# Patient Record
Sex: Male | Born: 1980 | Race: Black or African American | Hispanic: No | Marital: Single
Health system: Southern US, Community
[De-identification: ages and names within clinical notes are randomized; demographics above are authoritative.]

---

## 1998-06-19 ENCOUNTER — Emergency Department (HOSPITAL_COMMUNITY): Admission: EM | Admit: 1998-06-19 | Discharge: 1998-06-19 | Payer: Self-pay | Admitting: Emergency Medicine

## 2001-02-16 ENCOUNTER — Emergency Department (HOSPITAL_COMMUNITY): Admission: EM | Admit: 2001-02-16 | Discharge: 2001-02-16 | Payer: Self-pay | Admitting: Emergency Medicine

## 2001-02-16 ENCOUNTER — Encounter: Payer: Self-pay | Admitting: Emergency Medicine

## 2002-02-09 ENCOUNTER — Emergency Department (HOSPITAL_COMMUNITY): Admission: EM | Admit: 2002-02-09 | Discharge: 2002-02-09 | Payer: Self-pay

## 2002-03-17 ENCOUNTER — Emergency Department (HOSPITAL_COMMUNITY): Admission: EM | Admit: 2002-03-17 | Discharge: 2002-03-18 | Payer: Self-pay | Admitting: Emergency Medicine

## 2002-03-17 ENCOUNTER — Encounter: Payer: Self-pay | Admitting: Emergency Medicine

## 2017-12-25 ENCOUNTER — Emergency Department (HOSPITAL_COMMUNITY)
Admission: EM | Admit: 2017-12-25 | Discharge: 2017-12-26 | Disposition: A | Discharge status: Home / Self Care | Payer: BLUE CROSS/BLUE SHIELD | Attending: Emergency Medicine | Admitting: Emergency Medicine

## 2017-12-25 ENCOUNTER — Emergency Department (HOSPITAL_COMMUNITY): Payer: BLUE CROSS/BLUE SHIELD

## 2017-12-25 ENCOUNTER — Encounter (HOSPITAL_COMMUNITY): Payer: Self-pay | Admitting: Emergency Medicine

## 2017-12-25 ENCOUNTER — Other Ambulatory Visit: Payer: Self-pay

## 2017-12-25 DIAGNOSIS — R55 Syncope and collapse: Secondary | ICD-10-CM | POA: Insufficient documentation

## 2017-12-25 DIAGNOSIS — R7989 Other specified abnormal findings of blood chemistry: Secondary | ICD-10-CM | POA: Diagnosis not present

## 2017-12-25 DIAGNOSIS — R079 Chest pain, unspecified: Secondary | ICD-10-CM | POA: Diagnosis not present

## 2017-12-25 LAB — CBC WITH DIFFERENTIAL/PLATELET
Abs Immature Granulocytes: 0 10*3/uL (ref 0.00–0.07)
BASOS ABS: 0 10*3/uL (ref 0.0–0.1)
BASOS PCT: 1 %
EOS PCT: 2 %
Eosinophils Absolute: 0.1 10*3/uL (ref 0.0–0.5)
HEMATOCRIT: 44.4 % (ref 39.0–52.0)
Hemoglobin: 14.3 g/dL (ref 13.0–17.0)
Immature Granulocytes: 0 %
LYMPHS ABS: 1.4 10*3/uL (ref 0.7–4.0)
Lymphocytes Relative: 45 %
MCH: 28.3 pg (ref 26.0–34.0)
MCHC: 32.2 g/dL (ref 30.0–36.0)
MCV: 87.9 fL (ref 80.0–100.0)
MONOS PCT: 8 %
Monocytes Absolute: 0.3 10*3/uL (ref 0.1–1.0)
NRBC: 0 % (ref 0.0–0.2)
Neutro Abs: 1.4 10*3/uL — ABNORMAL LOW (ref 1.7–7.7)
Neutrophils Relative %: 44 %
Platelets: 250 10*3/uL (ref 150–400)
RBC: 5.05 MIL/uL (ref 4.22–5.81)
RDW: 12.9 % (ref 11.5–15.5)
WBC: 3.1 10*3/uL — ABNORMAL LOW (ref 4.0–10.5)

## 2017-12-25 LAB — COMPREHENSIVE METABOLIC PANEL
ALT: 16 U/L (ref 0–44)
AST: 32 U/L (ref 15–41)
Albumin: 3.6 g/dL (ref 3.5–5.0)
Alkaline Phosphatase: 53 U/L (ref 38–126)
Anion gap: 8 (ref 5–15)
BUN: 8 mg/dL (ref 6–20)
CHLORIDE: 104 mmol/L (ref 98–111)
CO2: 24 mmol/L (ref 22–32)
Calcium: 8.5 mg/dL — ABNORMAL LOW (ref 8.9–10.3)
Creatinine, Ser: 1.28 mg/dL — ABNORMAL HIGH (ref 0.61–1.24)
Glucose, Bld: 152 mg/dL — ABNORMAL HIGH (ref 70–99)
POTASSIUM: 3.8 mmol/L (ref 3.5–5.1)
Sodium: 136 mmol/L (ref 135–145)
Total Bilirubin: 0.9 mg/dL (ref 0.3–1.2)
Total Protein: 5.7 g/dL — ABNORMAL LOW (ref 6.5–8.1)

## 2017-12-25 LAB — I-STAT CHEM 8, ED
BUN: 9 mg/dL (ref 6–20)
Calcium, Ion: 1.05 mmol/L — ABNORMAL LOW (ref 1.15–1.40)
Chloride: 101 mmol/L (ref 98–111)
Creatinine, Ser: 1.2 mg/dL (ref 0.61–1.24)
Glucose, Bld: 148 mg/dL — ABNORMAL HIGH (ref 70–99)
HEMATOCRIT: 42 % (ref 39.0–52.0)
HEMOGLOBIN: 14.3 g/dL (ref 13.0–17.0)
POTASSIUM: 3.9 mmol/L (ref 3.5–5.1)
SODIUM: 137 mmol/L (ref 135–145)
TCO2: 26 mmol/L (ref 22–32)

## 2017-12-25 LAB — I-STAT TROPONIN, ED: Troponin i, poc: 0 ng/mL (ref 0.00–0.08)

## 2017-12-25 MED ORDER — IOPAMIDOL (ISOVUE-370) INJECTION 76%
125.0000 mL | Freq: Once | INTRAVENOUS | Status: AC | PRN
  Administered 2017-12-25: 125 mL via INTRAVENOUS

## 2017-12-25 MED ORDER — IOPAMIDOL (ISOVUE-370) INJECTION 76%
50.0000 mL | Freq: Once | INTRAVENOUS | Status: AC | PRN
  Administered 2017-12-25: 50 mL via INTRAVENOUS

## 2017-12-25 MED ORDER — IOPAMIDOL (ISOVUE-370) INJECTION 76%
INTRAVENOUS | Status: AC
  Filled 2017-12-25: qty 100

## 2017-12-25 MED ORDER — IOPAMIDOL (ISOVUE-370) INJECTION 76%
INTRAVENOUS | Status: AC
  Filled 2017-12-25: qty 50

## 2017-12-25 NOTE — ED Triage Notes (Signed)
Pt at restaurant with friends and had just finished eating dinner. Pt states he felt severe pain in left side of neck at base of skull, lasted for 10 min. Caused near syncope. EMS arrived, pt grey and lying on ice pack on neck. CBG 110, BP systolic 100, HR NSR. 3/10 pain on arrival. 18 g LAC saline locked. Pt AO x 4 on arrival.

## 2017-12-25 NOTE — ED Provider Notes (Signed)
[facility] EMERGENCY DEPARTMENT Provider Note   CSN: [id number] Arrival date & time: 12/25/17  2229     History   Chief Complaint Chief Complaint  Patient presents with  . Near Syncope    HPI Duane Tucker is a 37 y.o. male with a hx of no major medical problems presents to the Emergency Department complaining of acute, persistent, posterior neck and head pain onset approx 30 min PTA.  Symptoms were associated with near syncope, vision changes and shortness of breath.  Pt reports he traveled via car today from [place].  He denies rash, difficulty swallowing, swelling of neck/tongue.  He denies CP, abd pain, vomiting.  He did have associated nausea.  Pt denies smoking or drug history.  He reports drinking 8oz of EtOH today.  Pt reports resolution of vision changes, but posterior neck pain persists.  Pt denies aggravating or alleviating factors.  Movement and palpation of his neck do not worsen his symptoms.    Per EMS, on their arrival, pt was profusely diaphoretic, gray in color and semi-responsive. Initial SBP 100.    The history is provided by the patient, the EMS personnel and medical records. No language interpreter was used.    History reviewed. No pertinent past medical history.  There are no active problems to display for this patient.   History reviewed. No pertinent surgical history.      Home Medications    Prior to Admission medications   Not on File    Family History No family history on file.  Social History Social History   Tobacco Use  . Smoking status: Not on file  Substance Use Topics  . Alcohol use: Yes    Comment: social   . Drug use: Not on file     Allergies   Acetaminophen   Review of Systems Review of Systems  Constitutional: Positive for diaphoresis. Negative for appetite change, fatigue, fever and unexpected weight change.  HENT: Negative for mouth sores.   Eyes: Negative for visual disturbance.    Respiratory: Positive for shortness of breath. Negative for cough, chest tightness and wheezing.   Cardiovascular: Negative for chest pain.  Gastrointestinal: Positive for nausea. Negative for abdominal pain, constipation, diarrhea and vomiting.  Endocrine: Negative for polydipsia, polyphagia and polyuria.  Genitourinary: Negative for dysuria, frequency, hematuria and urgency.  Musculoskeletal: Positive for neck pain. Negative for back pain and neck stiffness.  Skin: Negative for rash.  Allergic/Immunologic: Negative for immunocompromised state.  Neurological: Positive for syncope and light-headedness. Negative for headaches.  Hematological: Does not bruise/bleed easily.  Psychiatric/Behavioral: Negative for sleep disturbance. The patient is not nervous/anxious.      Physical Exam Updated Vital Signs BP 128/77 (BP Location: Right Arm)   Pulse 89   Resp 13   SpO2 100%   Physical Exam  Constitutional: He is oriented to person, place, and time. He appears well-developed and well-nourished. No distress.  Awake, alert, nontoxic appearance  HENT:  Head: Normocephalic and atraumatic.  Mouth/Throat: Oropharynx is clear and moist. No oropharyngeal exudate.  Eyes: Pupils are equal, round, and reactive to light. Conjunctivae and EOM are normal. No scleral icterus.  No horizontal, vertical or rotational nystagmus  Neck: Trachea normal, normal range of motion and phonation normal. Neck supple. Normal carotid pulses and no JVD present. No tracheal tenderness, no spinous process tenderness and no muscular tenderness present. Carotid bruit is not present. No neck rigidity. No tracheal deviation, no erythema and normal range of motion  present. No thyromegaly present.  Full active and passive range of motion without elicitation of worsening pain.  Patient of the midline and paraspinal muscles is without elicitation of pain.  Cardiovascular: Normal rate, regular rhythm and intact distal pulses.  No  murmur heard. Pulmonary/Chest: Effort normal and breath sounds normal. Tachypnea noted. No respiratory distress. He has no decreased breath sounds. He has no wheezes. He has no rales.  Equal chest expansion  Abdominal: Soft. Bowel sounds are normal. He exhibits no mass. There is no tenderness. There is no rebound and no guarding.  Musculoskeletal: Normal range of motion. He exhibits no edema.  No calf tenderness.  No peripheral edema.  Lymphadenopathy:    He has no cervical adenopathy.  Neurological: He is alert and oriented to person, place, and time. No cranial nerve deficit. He exhibits normal muscle tone. Coordination normal.  Mental Status:  Alert, oriented, thought content appropriate, able to give a coherent history. Speech fluent without evidence of aphasia. Able to follow 2 step commands without difficulty.  Cranial Nerves:  II:  Peripheral visual fields grossly normal, pupils equal, round, reactive to light III,IV, VI: ptosis not present, extra-ocular motions intact bilaterally  V,VII: smile symmetric, facial light touch sensation equal VIII: hearing grossly normal to voice  X: uvula elevates symmetrically  XI: bilateral shoulder shrug symmetric and strong XII: midline tongue extension without fassiculations Motor:  Normal tone. 5/5 in upper and lower extremities bilaterally including strong and equal grip strength and dorsiflexion/plantar flexion Sensory: light touch normal in all extremities.  Cerebellar: normal finger-to-nose with bilateral upper extremities Gait: normal gait and balance CV: distal pulses palpable throughout  Skin: Skin is warm and dry. No rash noted. He is not diaphoretic.  Psychiatric: He has a normal mood and affect. His behavior is normal. Judgment and thought content normal.  Nursing note and vitals reviewed.    ED Treatments / Results  Labs (all labs ordered are listed, but only abnormal results are displayed) Labs Reviewed  CBC WITH  DIFFERENTIAL/PLATELET - Abnormal; Notable for the following components:      Result Value   WBC 3.1 (*)    Neutro Abs 1.4 (*)    All other components within normal limits  COMPREHENSIVE METABOLIC PANEL - Abnormal; Notable for the following components:   Glucose, Bld 152 (*)    Creatinine, Ser 1.28 (*)    Calcium 8.5 (*)    Total Protein 5.7 (*)    All other components within normal limits  I-STAT CHEM 8, ED - Abnormal; Notable for the following components:   Glucose, Bld 148 (*)    Calcium, Ion 1.05 (*)    All other components within normal limits  I-STAT TROPONIN, ED    Radiology Ct Angio Head W Or Wo Contrast  Result Date: 12/26/2017 CLINICAL DATA:  37 y/o M; severe pain in left-sided neck extending to base of skull for 10 minutes with near syncope. EXAM: CT ANGIOGRAPHY HEAD AND NECK TECHNIQUE: Multidetector CT imaging of the head and neck was performed using the standard protocol during bolus administration of intravenous contrast. Multiplanar CT image reconstructions and MIPs were obtained to evaluate the vascular anatomy. Carotid stenosis measurements (when applicable) are obtained utilizing NASCET criteria, using the distal internal carotid diameter as the denominator. CONTRAST:  125 cc Isovue 370 COMPARISON:  None. FINDINGS: CT HEAD FINDINGS Brain: No evidence of acute infarction, hemorrhage, hydrocephalus, extra-axial collection or mass lesion/mass effect. Vascular: No hyperdense vessel or unexpected calcification. Skull: Normal. Negative for  fracture or focal lesion. Sinuses: Small mucous retention cysts within the left maxillary sinus. Mild mucosal thickening the right frontal sinus. Normal aeration of mastoid air cells. Orbits are unremarkable. Orbits: No acute finding. Review of the MIP images confirms the above findings CTA NECK FINDINGS Aortic arch: Standard branching. Imaged portion shows no evidence of aneurysm or dissection. No significant stenosis of the major arch vessel  origins. Right carotid system: No evidence of dissection, stenosis (50% or greater) or occlusion. Left carotid system: No evidence of dissection, stenosis (50% or greater) or occlusion. Vertebral arteries: Codominant. No evidence of dissection, stenosis (50% or greater) or occlusion. Skeleton: Negative. Other neck: Incidental torus mandibularis. Upper chest: Negative. Review of the MIP images confirms the above findings CTA HEAD FINDINGS Anterior circulation: No significant stenosis, proximal occlusion, aneurysm, or vascular malformation. Posterior circulation: No significant stenosis, proximal occlusion, aneurysm, or vascular malformation. Venous sinuses: As permitted by contrast timing, patent. Anatomic variants: None significant. Delayed phase: No abnormal intracranial enhancement. Review of the MIP images confirms the above findings IMPRESSION: Normal CTA of the head and neck. Electronically Signed   By: Mitzi Hansen M.D.   On: 12/26/2017 00:07   Ct Angio Neck W Or Wo Contrast  Result Date: 12/26/2017 CLINICAL DATA:  37 y/o M; severe pain in left-sided neck extending to base of skull for 10 minutes with near syncope. EXAM: CT ANGIOGRAPHY HEAD AND NECK TECHNIQUE: Multidetector CT imaging of the head and neck was performed using the standard protocol during bolus administration of intravenous contrast. Multiplanar CT image reconstructions and MIPs were obtained to evaluate the vascular anatomy. Carotid stenosis measurements (when applicable) are obtained utilizing NASCET criteria, using the distal internal carotid diameter as the denominator. CONTRAST:  125 cc Isovue 370 COMPARISON:  None. FINDINGS: CT HEAD FINDINGS Brain: No evidence of acute infarction, hemorrhage, hydrocephalus, extra-axial collection or mass lesion/mass effect. Vascular: No hyperdense vessel or unexpected calcification. Skull: Normal. Negative for fracture or focal lesion. Sinuses: Small mucous retention cysts within the left  maxillary sinus. Mild mucosal thickening the right frontal sinus. Normal aeration of mastoid air cells. Orbits are unremarkable. Orbits: No acute finding. Review of the MIP images confirms the above findings CTA NECK FINDINGS Aortic arch: Standard branching. Imaged portion shows no evidence of aneurysm or dissection. No significant stenosis of the major arch vessel origins. Right carotid system: No evidence of dissection, stenosis (50% or greater) or occlusion. Left carotid system: No evidence of dissection, stenosis (50% or greater) or occlusion. Vertebral arteries: Codominant. No evidence of dissection, stenosis (50% or greater) or occlusion. Skeleton: Negative. Other neck: Incidental torus mandibularis. Upper chest: Negative. Review of the MIP images confirms the above findings CTA HEAD FINDINGS Anterior circulation: No significant stenosis, proximal occlusion, aneurysm, or vascular malformation. Posterior circulation: No significant stenosis, proximal occlusion, aneurysm, or vascular malformation. Venous sinuses: As permitted by contrast timing, patent. Anatomic variants: None significant. Delayed phase: No abnormal intracranial enhancement. Review of the MIP images confirms the above findings IMPRESSION: Normal CTA of the head and neck. Electronically Signed   By: Mitzi Hansen M.D.   On: 12/26/2017 00:07   Ct Angio Chest Pe W And/or Wo Contrast  Result Date: 12/26/2017 CLINICAL DATA:  Chest pain EXAM: CT ANGIOGRAPHY CHEST WITH CONTRAST TECHNIQUE: Multidetector CT imaging of the chest was performed using the standard protocol during bolus administration of intravenous contrast. Multiplanar CT image reconstructions and MIPs were obtained to evaluate the vascular anatomy. CONTRAST:  50 mL ISOVUE-370 IOPAMIDOL (ISOVUE-370) INJECTION 76%  COMPARISON:  None. FINDINGS: Cardiovascular: There is no demonstrable pulmonary embolus. There is no thoracic aortic aneurysm or dissection. Visualized great  vessels appear normal. No pericardial effusion or pericardial thickening. Mediastinum/Nodes: Thyroid appears unremarkable. There is no appreciable thoracic adenopathy. No esophageal lesions are appreciable. Lungs/Pleura: There is no edema or consolidation. No pleural effusion or pleural thickening evident. Upper Abdomen: Visualized upper abdominal structures appear normal. Stomach is noted to be filled with food material. Musculoskeletal: There is thoracic dextroscoliosis. There are no blastic or lytic bone lesions. No evident chest wall lesions. Review of the MIP images confirms the above findings. IMPRESSION: 1. No demonstrable pulmonary embolus. No thoracic aortic aneurysm or dissection. 2.  Lungs clear. 3.  No evident thoracic adenopathy. Electronically Signed   By: Bretta Bang III M.D.   On: 12/26/2017 00:07    Procedures Procedures (including critical care time)  Medications Ordered in ED Medications  iopamidol (ISOVUE-370) 76 % injection 125 mL (125 mLs Intravenous Contrast Given 12/25/17 2304)  iopamidol (ISOVUE-370) 76 % injection 50 mL (50 mLs Intravenous Contrast Given 12/25/17 2347)  sodium chloride 0.9 % bolus 1,000 mL (0 mLs Intravenous Stopped 12/26/17 0155)     Initial Impression / Assessment and Plan / ED Course  I have reviewed the triage vital signs and the nursing notes.  Pertinent labs & imaging results that were available during my care of the patient were reviewed by me and considered in my medical decision making (see chart for details).  Clinical Course as of Dec 27 727  Wynelle Link Dec 26, 2017  0113 Reports that he feels totally normal at this time.   [HM]  0113 Slightly elevated.  Unknown baseline.  Fluids given.  Creatinine(!): 1.28 [HM]  0114 Tachypneic on arrival  Resp(!): 24 [HM]  0136 Patient is without orthostatic dizziness when standing.  He ambulates without difficulty.  He has tolerated p.o. without difficulty.   [HM]    Clinical Course User  Index [HM] Andjela Wickes, Dahlia Client, [place]    Patient presents with sudden onset posterior neck and head pain with near syncope.  Patient diaphoretic and pale upon EMS arrival.  Additionally, patient with shortness of breath.  Upon arrival to the emergency department patient is without tachycardia however he is tachypneic and continues to complain of shortness of breath.  He reports pain in his neck and head are improving but have not resolved.  He denies visual changes.  Neurologic exam without acute abnormality.  With sudden onset pain and near syncope I am concerned about subarachnoid hemorrhage versus vertebral dissection.  Additionally, patient with recent travel, tachypnea and shortness of breath.  Concern for possible pulmonary embolism with syncope.  Patient is not low risk.  CT angios of the head and neck are without evidence of subarachnoid hemorrhage or vertebral dissection.  No abnormalities are seen.  CT angios of the chest is without evidence of pulmonary embolism, pneumothorax or pulmonary edema.  Labs are reassuring.  I personally evaluated these images.  Slightly elevated serum creatinine.  Fluids were given.  BP 128/77 (BP Location: Right Arm)   Pulse 89   Resp 13   SpO2 100%    Patient's vital signs improved and symptoms resolved completely while here in the emergency department.  He has tolerated p.o. and ambulated without difficulty.  Patient remains neurologically intact.  Discussed the importance of close primary care follow-up with patient and mother.  Also discussed reasons to return immediately to the emergency department including return of any symptoms.  They state understanding and are in agreement with the plan.  Final Clinical Impressions(s) / ED Diagnoses   Final diagnoses:  Near syncope  Elevated serum creatinine    ED Discharge Orders    None       Mardene Sayer Boyd Kerbs 12/26/17 1610    Charlynne Pander, MD 12/26/17 [phone]

## 2017-12-26 MED ORDER — SODIUM CHLORIDE 0.9 % IV BOLUS
1000.0000 mL | Freq: Once | INTRAVENOUS | Status: AC
  Administered 2017-12-26: 1000 mL via INTRAVENOUS

## 2017-12-26 NOTE — Discharge Instructions (Addendum)
1. Medications: usual home medications 2. Treatment: rest, drink plenty of fluids,  3. Follow Up: Please followup with your primary doctor in 2-3 days for discussion of your diagnoses and further evaluation after today's visit; if you do not have a primary care doctor use the resource guide provided to find one; Please return to the ER for chest pain, shortness of breath, return of symptoms or other concerns

## 2019-09-05 IMAGING — CT CT ANGIO CHEST
2 of 6 series · 19 of 46 positions shown · IV contrast (iopamidol)
Comparison: None.

CLINICAL DATA: Chest pain

EXAM:
CT ANGIOGRAPHY CHEST WITH CONTRAST
TECHNIQUE: Multidetector CT imaging of the chest was performed using the
standard protocol during bolus administration of intravenous
contrast. Multiplanar CT image reconstructions and MIPs were
obtained to evaluate the vascular anatomy.
CONTRAST:  50 mL [id number] IOPAMIDOL (XJ67BN-NCC) INJECTION 76%

[Series 8: thins · axial · 0.72mm/px · z∈[-569,-311]mm · 16 of 405 slices shown]
[im 18/405  lung]
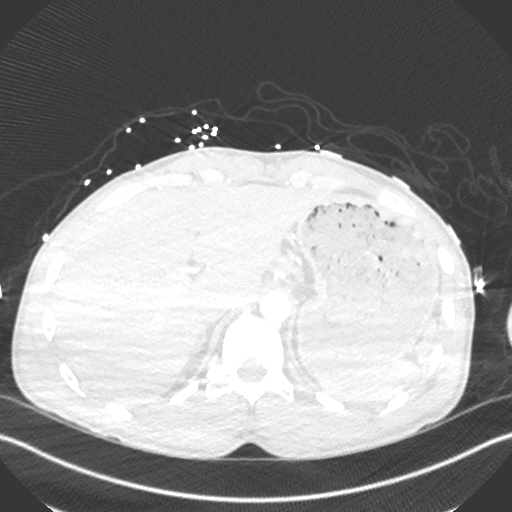
[im 53/405  soft-tissue]
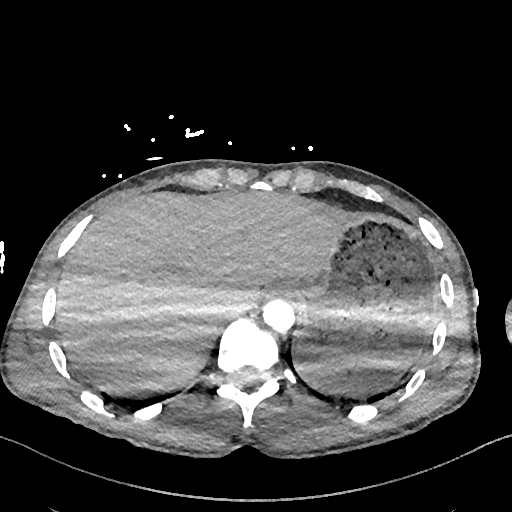
[im 71/405  lung]
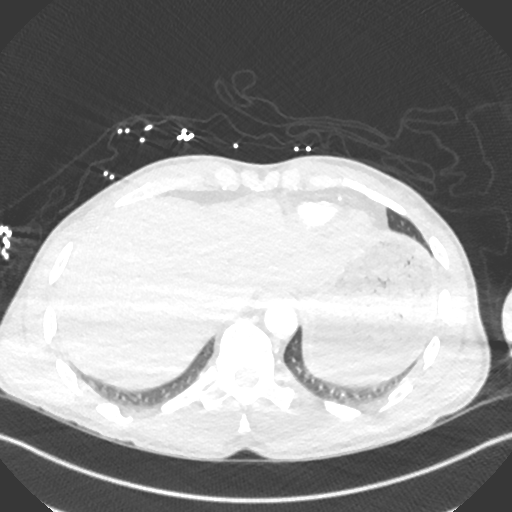
[im 88/405  soft-tissue]
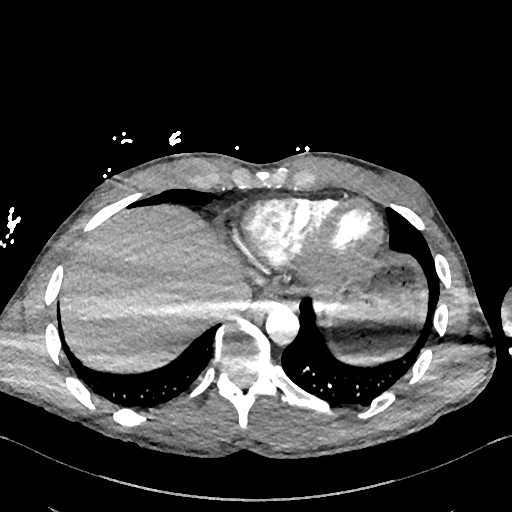
[im 123/405  lung]
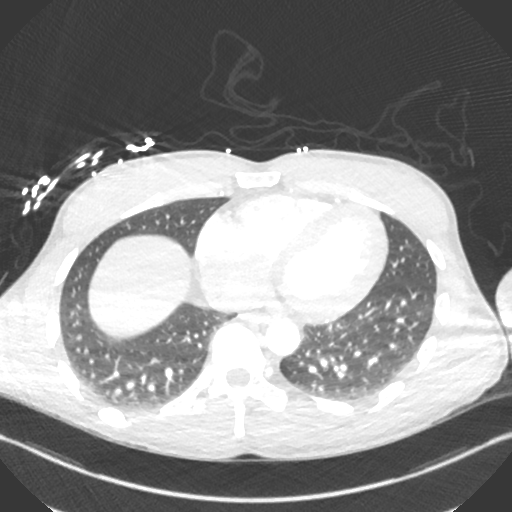
[im 141/405  soft-tissue]
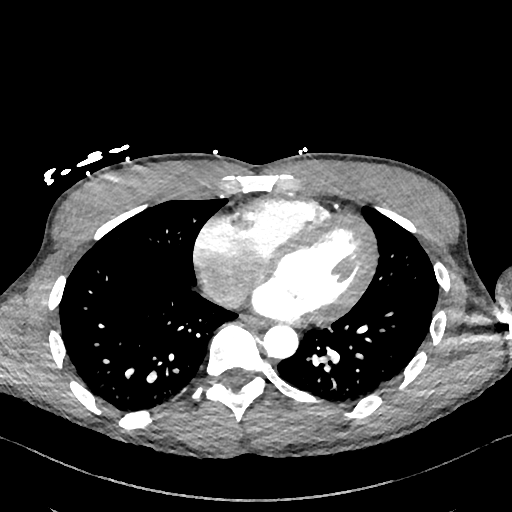
[im 159/405  lung]
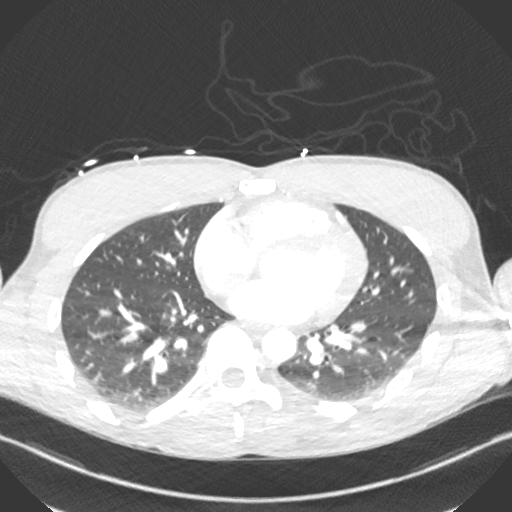
[im 194/405  soft-tissue]
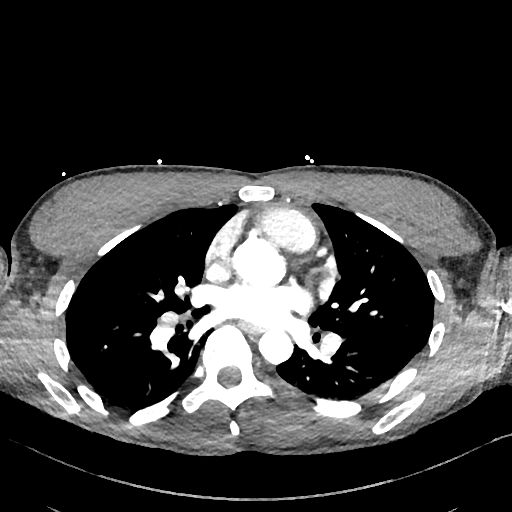
[im 211/405  lung]
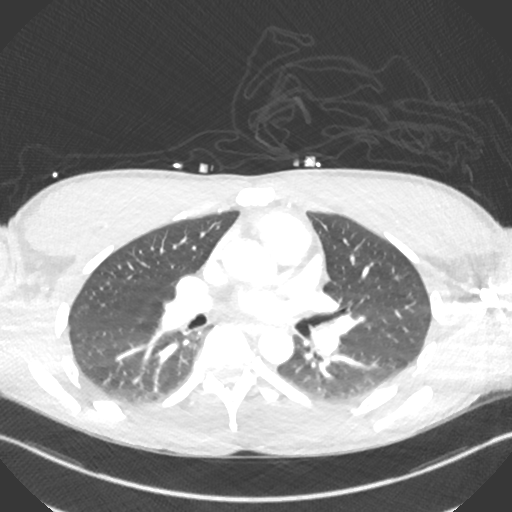
[im 246/405  soft-tissue]
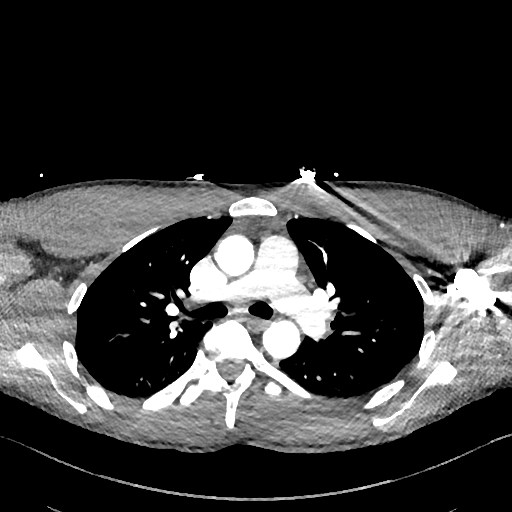
[im 264/405  lung]
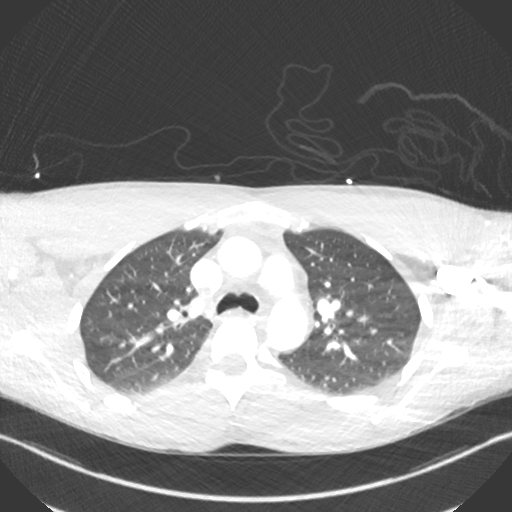
[im 282/405  soft-tissue]
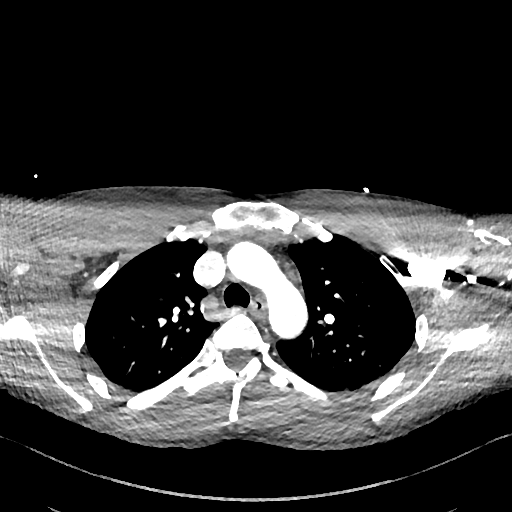
[im 317/405  lung]
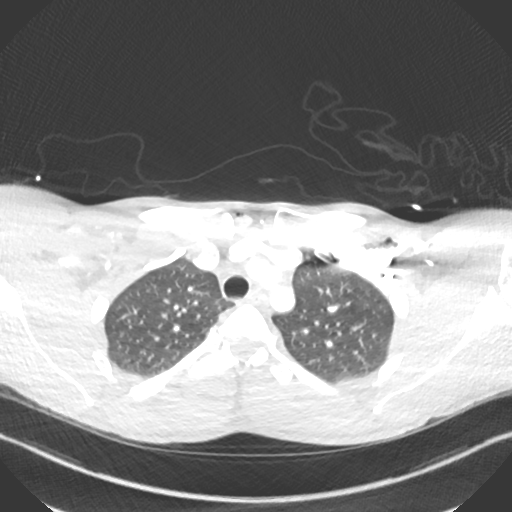
[im 334/405  soft-tissue]
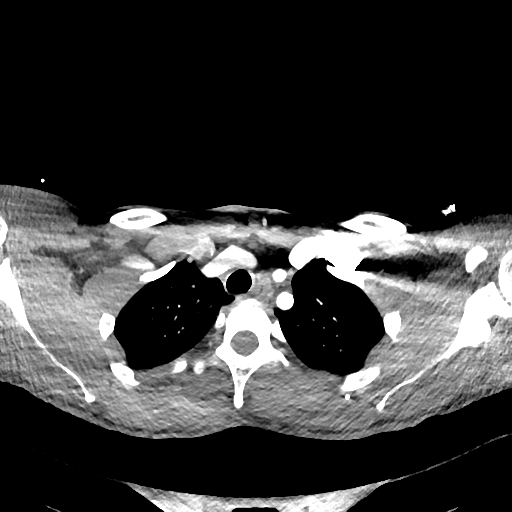
[im 352/405  lung]
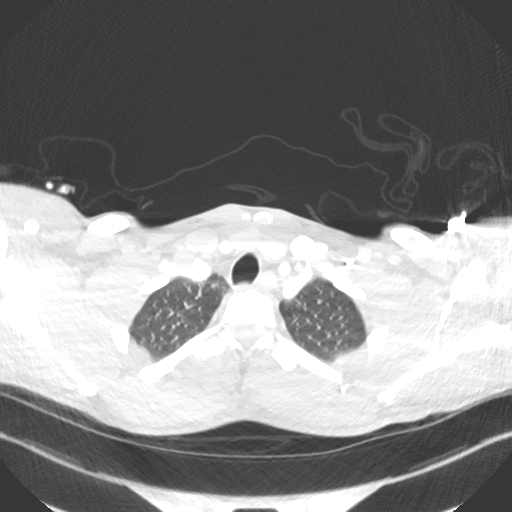
[im 387/405  soft-tissue]
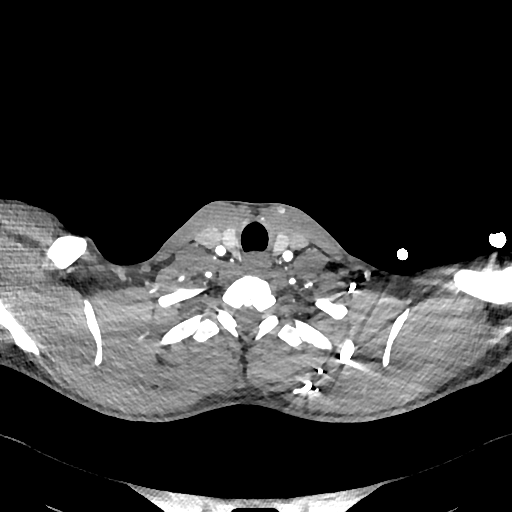

[Series 10: cor · coronal · 0.54mm/px · 3 of 107 slices shown]
[im 27/107  soft-tissue]
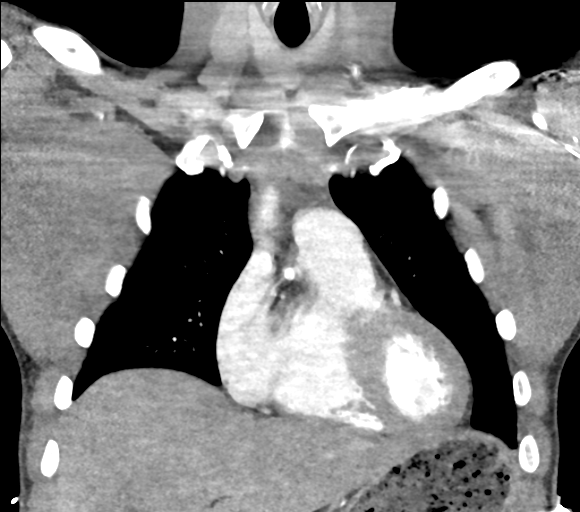
[im 54/107  soft-tissue]
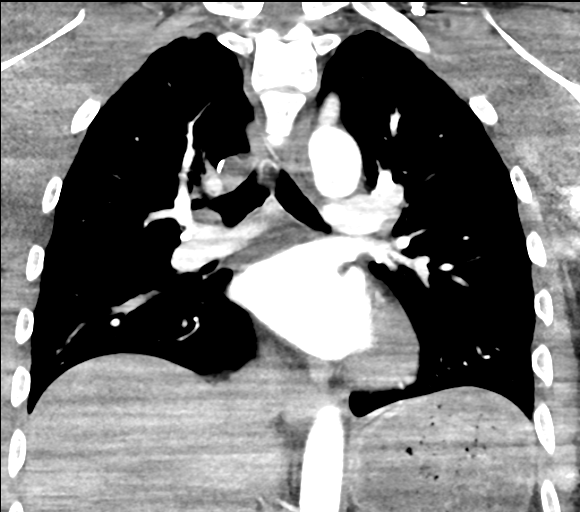
[im 80/107  soft-tissue]
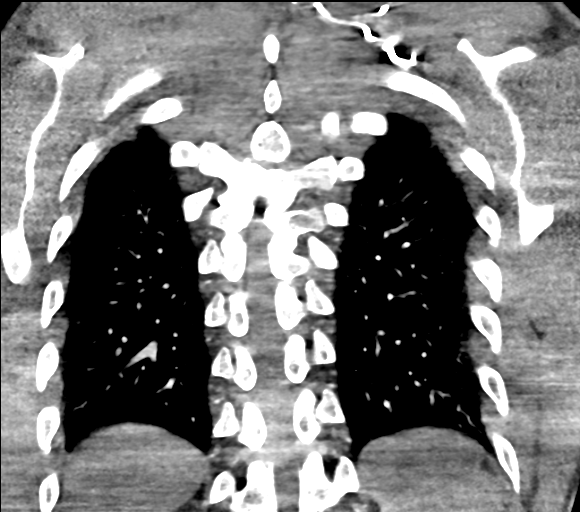

[19 of 46 positions shown; findings below may reference images not displayed]

FINDINGS: Cardiovascular: There is no demonstrable pulmonary embolus. There is
no thoracic aortic aneurysm or dissection. Visualized great vessels
appear normal. No pericardial effusion or pericardial thickening.

Mediastinum/Nodes: Thyroid appears unremarkable. There is no
appreciable thoracic adenopathy. No esophageal lesions are
appreciable.

Lungs/Pleura: There is no edema or consolidation. No pleural
effusion or pleural thickening evident.

Upper Abdomen: Visualized upper abdominal structures appear normal.
Stomach is noted to be filled with food material.

Musculoskeletal: There is thoracic dextroscoliosis. There are no
blastic or lytic bone lesions. No evident chest wall lesions.

Review of the MIP images confirms the above findings.
IMPRESSION: 1. No demonstrable pulmonary embolus. No thoracic aortic aneurysm or
dissection.

2.  Lungs clear.

3.  No evident thoracic adenopathy.
